# Patient Record
Sex: Male | Born: 1975 | Race: Black or African American | Hispanic: No | Marital: Married | Smoking: Never smoker
Health system: Southern US, Community
[De-identification: ages and names within clinical notes are randomized; demographics above are authoritative.]

## PROBLEM LIST (undated history)

## (undated) DIAGNOSIS — E785 Hyperlipidemia, unspecified: Secondary | ICD-10-CM

## (undated) DIAGNOSIS — I10 Essential (primary) hypertension: Secondary | ICD-10-CM

## (undated) DIAGNOSIS — E119 Type 2 diabetes mellitus without complications: Secondary | ICD-10-CM

## (undated) HISTORY — DX: Essential (primary) hypertension: I10

## (undated) HISTORY — DX: Hyperlipidemia, unspecified: E78.5

## (undated) HISTORY — DX: Type 2 diabetes mellitus without complications: E11.9

---

## 2005-10-17 ENCOUNTER — Emergency Department (HOSPITAL_COMMUNITY): Admission: EM | Admit: 2005-10-17 | Discharge: 2005-10-17 | Payer: Self-pay | Admitting: Emergency Medicine

## 2006-05-18 ENCOUNTER — Ambulatory Visit: Payer: Self-pay

## 2009-11-13 DIAGNOSIS — E7800 Pure hypercholesterolemia, unspecified: Secondary | ICD-10-CM | POA: Insufficient documentation

## 2014-01-26 LAB — BASIC METABOLIC PANEL: GLUCOSE: 98 mg/dL

## 2014-03-13 ENCOUNTER — Ambulatory Visit: Payer: Self-pay | Admitting: Family Medicine

## 2014-03-22 ENCOUNTER — Ambulatory Visit: Payer: Self-pay | Admitting: Family Medicine

## 2014-05-17 DIAGNOSIS — E119 Type 2 diabetes mellitus without complications: Secondary | ICD-10-CM | POA: Insufficient documentation

## 2014-05-17 DIAGNOSIS — IMO0002 Reserved for concepts with insufficient information to code with codable children: Secondary | ICD-10-CM | POA: Insufficient documentation

## 2014-05-17 DIAGNOSIS — E1129 Type 2 diabetes mellitus with other diabetic kidney complication: Secondary | ICD-10-CM | POA: Insufficient documentation

## 2014-05-17 DIAGNOSIS — M25569 Pain in unspecified knee: Secondary | ICD-10-CM | POA: Insufficient documentation

## 2014-05-17 DIAGNOSIS — E1165 Type 2 diabetes mellitus with hyperglycemia: Secondary | ICD-10-CM | POA: Insufficient documentation

## 2014-07-04 ENCOUNTER — Ambulatory Visit: Payer: Self-pay | Admitting: Family Medicine

## 2014-08-07 ENCOUNTER — Ambulatory Visit: Payer: Self-pay | Admitting: Family Medicine

## 2014-11-06 ENCOUNTER — Ambulatory Visit: Payer: Self-pay | Admitting: Family Medicine

## 2014-11-21 ENCOUNTER — Ambulatory Visit: Payer: Self-pay | Admitting: Family Medicine

## 2014-12-19 ENCOUNTER — Ambulatory Visit: Payer: BC Managed Care – PPO | Admitting: Family Medicine

## 2014-12-25 ENCOUNTER — Encounter: Payer: Self-pay | Admitting: Family Medicine

## 2014-12-25 ENCOUNTER — Ambulatory Visit (INDEPENDENT_AMBULATORY_CARE_PROVIDER_SITE_OTHER): Payer: BC Managed Care – PPO | Admitting: Family Medicine

## 2014-12-25 VITALS — BP 118/78 | HR 78 | Temp 98.4°F | Resp 18 | Ht 74.0 in | Wt 222.6 lb

## 2014-12-25 DIAGNOSIS — E1165 Type 2 diabetes mellitus with hyperglycemia: Secondary | ICD-10-CM

## 2014-12-25 DIAGNOSIS — E1121 Type 2 diabetes mellitus with diabetic nephropathy: Secondary | ICD-10-CM

## 2014-12-25 DIAGNOSIS — IMO0002 Reserved for concepts with insufficient information to code with codable children: Secondary | ICD-10-CM

## 2014-12-25 DIAGNOSIS — E1169 Type 2 diabetes mellitus with other specified complication: Secondary | ICD-10-CM

## 2014-12-25 DIAGNOSIS — Z9119 Patient's noncompliance with other medical treatment and regimen: Secondary | ICD-10-CM | POA: Diagnosis not present

## 2014-12-25 DIAGNOSIS — Z23 Encounter for immunization: Secondary | ICD-10-CM | POA: Diagnosis not present

## 2014-12-25 DIAGNOSIS — I10 Essential (primary) hypertension: Secondary | ICD-10-CM

## 2014-12-25 DIAGNOSIS — Z91199 Patient's noncompliance with other medical treatment and regimen due to unspecified reason: Secondary | ICD-10-CM

## 2014-12-25 DIAGNOSIS — E7800 Pure hypercholesterolemia, unspecified: Secondary | ICD-10-CM | POA: Diagnosis not present

## 2014-12-25 LAB — POCT UA - MICROALBUMIN: MICROALBUMIN (UR) POC: 0 mg/L

## 2014-12-25 LAB — POCT GLYCOSYLATED HEMOGLOBIN (HGB A1C): HEMOGLOBIN A1C: 13

## 2014-12-25 MED ORDER — INSULIN ASPART 100 UNIT/ML ~~LOC~~ SOLN: 15.0000 [IU] | Freq: Three times a day (TID) | SUBCUTANEOUS | Status: AC

## 2014-12-25 MED ORDER — INSULIN DETEMIR 100 UNIT/ML ~~LOC~~ SOLN: 40.0000 [IU] | Freq: Two times a day (BID) | SUBCUTANEOUS | Status: AC

## 2014-12-25 NOTE — Patient Instructions (Signed)
Diabetes Mellitus and Food It is important for you to manage your blood sugar (glucose) level. Your blood glucose level can be greatly affected by what you eat. Eating healthier foods in the appropriate amounts throughout the day at about the same time each day will help you control your blood glucose level. It can also help slow or prevent worsening of your diabetes mellitus. Healthy eating may even help you improve the level of your blood pressure and reach or maintain a healthy weight.  General recommendations for healthful eating and cooking habits include:  Eating meals and snacks regularly. Avoid going long periods of time without eating to lose weight.  Eating a diet that consists mainly of plant-based foods, such as fruits, vegetables, nuts, legumes, and whole grains.  Using low-heat cooking methods, such as baking, instead of high-heat cooking methods, such as deep frying. Work with your dietitian to make sure you understand how to use the Nutrition Facts information on food labels. HOW CAN FOOD AFFECT ME? Carbohydrates Carbohydrates affect your blood glucose level more than any other type of food. Your dietitian will help you determine how many carbohydrates to eat at each meal and teach you how to count carbohydrates. Counting carbohydrates is important to keep your blood glucose at a healthy level, especially if you are using insulin or taking certain medicines for diabetes mellitus. Alcohol Alcohol can cause sudden decreases in blood glucose (hypoglycemia), especially if you use insulin or take certain medicines for diabetes mellitus. Hypoglycemia can be a life-threatening condition. Symptoms of hypoglycemia (sleepiness, dizziness, and disorientation) are similar to symptoms of having too much alcohol.  If your health care provider has given you approval to drink alcohol, do so in moderation and use the following guidelines:  Women should not have more than one drink per day, and men  should not have more than two drinks per day. One drink is equal to:  12 oz of beer.  5 oz of wine.  1 oz of hard liquor.  Do not drink on an empty stomach.  Keep yourself hydrated. Have water, diet soda, or unsweetened iced tea.  Regular soda, juice, and other mixers might contain a lot of carbohydrates and should be counted. WHAT FOODS ARE NOT RECOMMENDED? As you make food choices, it is important to remember that all foods are not the same. Some foods have fewer nutrients per serving than other foods, even though they might have the same number of calories or carbohydrates. It is difficult to get your body what it needs when you eat foods with fewer nutrients. Examples of foods that you should avoid that are high in calories and carbohydrates but low in nutrients include:  Trans fats (most processed foods list trans fats on the Nutrition Facts label).  Regular soda.  Juice.  Candy.  Sweets, such as cake, pie, doughnuts, and cookies.  Fried foods. WHAT FOODS CAN I EAT? Eat nutrient-rich foods, which will nourish your body and keep you healthy. The food you should eat also will depend on several factors, including:  The calories you need.  The medicines you take.  Your weight.  Your blood glucose level.  Your blood pressure level.  Your cholesterol level. You should eat a variety of foods, including:  Protein.  Lean cuts of meat.  Proteins low in saturated fats, such as fish, egg whites, and beans. Avoid processed meats.  Fruits and vegetables.  Fruits and vegetables that may help control blood glucose levels, such as apples, mangoes, and   yams.  Dairy products.  Choose fat-free or low-fat dairy products, such as milk, yogurt, and cheese.  Grains, bread, pasta, and rice.  Choose whole grain products, such as multigrain bread, whole oats, and brown rice. These foods may help control blood pressure.  Fats.  Foods containing healthful fats, such as nuts,  avocado, olive oil, canola oil, and fish. DOES EVERYONE WITH DIABETES MELLITUS HAVE THE SAME MEAL PLAN? Because every person with diabetes mellitus is different, there is not one meal plan that works for everyone. It is very important that you meet with a dietitian who will help you create a meal plan that is just right for you.   This information is not intended to replace advice given to you by your health care provider. Make sure you discuss any questions you have with your health care provider.   Document Released: 10/10/2004 Document Revised: 02/03/2014 Document Reviewed: 12/10/2012 Elsevier Interactive Patient Education 2016 [employer]  

## 2014-12-25 NOTE — Progress Notes (Signed)
Name: Darren Singleton   MRN: [id number]    DOB: December 08, 1975   Date:12/25/2014       Progress Note  Subjective  Chief Complaint  Chief Complaint  Patient presents with  . Diabetes  . Hypertension  . Hyperlipidemia    Diabetes Pertinent negatives for hypoglycemia include no dizziness, headaches, nervousness/anxiousness, seizures or tremors. Associated symptoms include polydipsia. Pertinent negatives for diabetes include no blurred vision, no chest pain, no weakness and no weight loss.  Hypertension Pertinent negatives include no blurred vision, chest pain, headaches, neck pain, orthopnea, palpitations or shortness of breath.  Hyperlipidemia Pertinent negatives include no chest pain, focal weakness, myalgias or shortness of breath.  Diabetes   Patient presents for follow-up of diabetes which is present for his age over 5 years years. Is currently on a regimen o .Levemir 40 international units twice a day and NovoLog 100 units 3 times a day with very questionable compliance Patient stateoccasionallywith their diet and exercise. There's been no hypoglycemic episodes and thereispolyuria polydipsia polyphagia. His average fasting glucoses been in the low aroununknown as he is not checking his sugarswith a high aroun-. There is no end organ disease.  Last diabetic eye exam was  more in a year ago   Last visit with dietitian was  was 1 year ago Last microalbumin was obtaitoday and is 0. Patient is chronically noncompliant and has not gotten blood drawn despite being giving the requisition of numerous occasions.   Hypertension   Patient presents for follow-up of hypertension. It has been present for over over 5 years.  Patient states that there is compliance with medical regimen which consists of lisinopril 5 mg once daily when he takes it . There is no end organ disease. Cardiac risk factors include hypertension hyperlipidemia and diabetes and hypertension.  Exercise regimen consist of minimal .   Diet consist of some salt restriction  Hyperlipidemia  Patient has a history of hyperlipidemia for over 5 atorvastatin 40 mg daily at bedtime when he is taking it years.  Current medical regimen consist of atorvastatin 40 mg by mouth daily at bedtime when he is taking it .  Compliance is poor .  Diet and exercise are currently followed rarely .  Risk factors for cardiovascular disease include hyperlipidemia hypertension medical noncompliance sedentary lifestyle noncompliance with meds .   There have been no side effects from the medication.   .    Past Medical History  Diagnosis Date  . Hypertension   . Hyperlipidemia   . Diabetes mellitus without complication [facility])     Social History  Substance Use Topics  . Smoking status: Never Smoker   . Smokeless tobacco: Not on file  . Alcohol Use: No     Current outpatient prescriptions:  .  aspirin 81 MG tablet, Take 81 mg by mouth daily., Disp: , Rfl:  .  insulin aspart (NOVOLOG) 100 UNIT/ML injection, Inject 15 Units into the skin 3 (three) times daily before meals., Disp: , Rfl:  .  insulin detemir (LEVEMIR) 100 UNIT/ML injection, Inject 40 Units into the skin 2 (two) times daily., Disp: , Rfl:   No Known Allergies  Review of Systems  Constitutional: Negative for fever, chills and weight loss.  HENT: Negative for congestion, hearing loss, sore throat and tinnitus.   Eyes: Negative for blurred vision, double vision and redness.  Respiratory: Negative for cough, hemoptysis and shortness of breath.   Cardiovascular: Negative for chest pain, palpitations, orthopnea, claudication and leg swelling.  Gastrointestinal: Negative  for heartburn, nausea, vomiting, diarrhea, constipation and blood in stool.  Genitourinary: Positive for frequency. Negative for dysuria, urgency and hematuria.  Musculoskeletal: Negative for myalgias, back pain, joint pain, falls and neck pain.  Skin: Negative for itching.  Neurological: Negative for dizziness,  tingling, tremors, focal weakness, seizures, loss of consciousness, weakness and headaches.  Endo/Heme/Allergies: Positive for polydipsia. Does not bruise/bleed easily.  Psychiatric/Behavioral: Negative for depression and substance abuse. The patient is not nervous/anxious and does not have insomnia.      Objective  Filed Vitals:   12/25/14 0849  BP: 118/78  Pulse: 78  Temp: 98.4 F (36.9 C)  Resp: 18  Height: 6\' 2"  (1.88 m)  Weight: 222 lb 9 oz (100.954 kg)  SpO2: 96%     Physical Exam  Constitutional: He is oriented to person, place, and time and well-developed, well-nourished, and in no distress.  HENT:  Head: Normocephalic.  Eyes: EOM are normal. Pupils are equal, round, and reactive to light.  Neck: Normal range of motion. Neck supple. No thyromegaly present.  Cardiovascular: Normal rate, regular rhythm and normal heart sounds.   No murmur heard. Pulmonary/Chest: Effort normal and breath sounds normal. No respiratory distress. He has no wheezes.  Abdominal: Soft. Bowel sounds are normal.  Musculoskeletal: Normal range of motion. He exhibits no edema.  Lymphadenopathy:    He has no cervical adenopathy.  Neurological: He is alert and oriented to person, place, and time. No cranial nerve deficit. Gait normal. Coordination normal.  Skin: Skin is warm and dry. No rash noted.  Psychiatric: Affect and judgment normal.      Assessment & Plan  1. Type 2 diabetes mellitus with other specified complication (HCC) Chronically noncompliant - POCT HgB A1C - POCT UA - Microalbumin - C peptide antibody, IgG - Ambulatory referral to Endocrinology  2. Benign essential HTN Well-controlled - Comprehensive Metabolic Panel (CMET)  3. Hypercholesterolemia without hypertriglyceridemia Again record given requisition for lab draw - Lipid Profile - TSH  4. Need for influenza vaccination Given - Flu Vaccine QUAD 36+ mos PF IM (Fluarix & Fluzone Quad PF)  5. Uncontrolled type 2  diabetes mellitus with diabetic nephropathy, without long-term current use of insulin (HCC) Referral to nephrologist  6. H/O noncompliance with medical treatment, presenting hazards to health Again admonished diabetes untreated will lead to vascular disease possible renal failure and dialysis blindness amputations cardiac disease and death

## 2014-12-29 DIAGNOSIS — Z91199 Patient's noncompliance with other medical treatment and regimen due to unspecified reason: Secondary | ICD-10-CM | POA: Insufficient documentation

## 2014-12-29 DIAGNOSIS — Z9119 Patient's noncompliance with other medical treatment and regimen: Secondary | ICD-10-CM | POA: Insufficient documentation

## 2015-04-24 ENCOUNTER — Ambulatory Visit: Payer: BC Managed Care – PPO | Admitting: Family Medicine

## 2015-05-14 ENCOUNTER — Telehealth: Payer: Self-pay | Admitting: Family Medicine

## 2015-05-14 NOTE — Telephone Encounter (Signed)
Patient requesting samples of novolog and levimer

## 2015-06-08 IMAGING — CR DG KNEE COMPLETE 4+V*R*
1 series · 4 of 4 positions shown · non-contrast
Comparison: None.

CLINICAL DATA: RIGHT knee pain since fell 4 days ago landing on
lateral aspect of RIGHT knee

EXAM:
None

[Series 1: kdxr knee rt comp with obliques · 0.14mm/px · 4 of 4 slices shown]
[im 1/4]
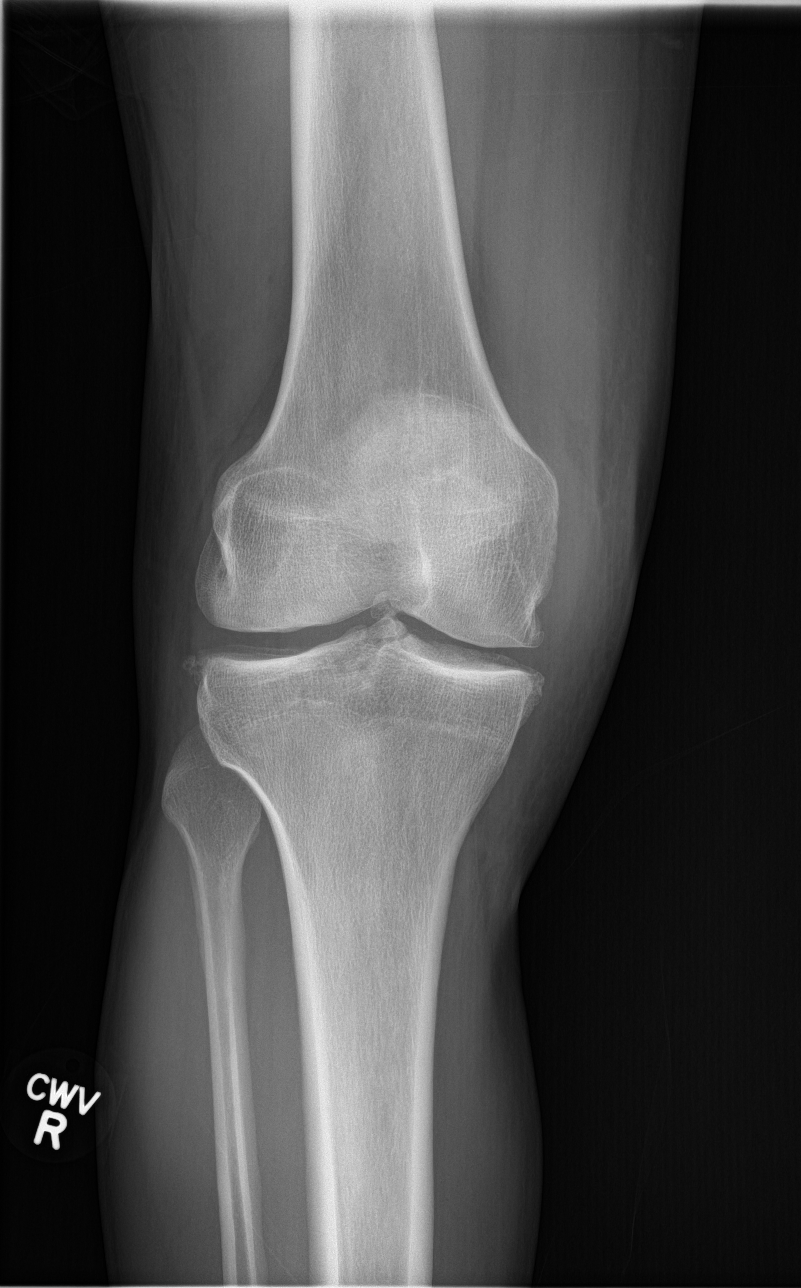
[im 2/4]
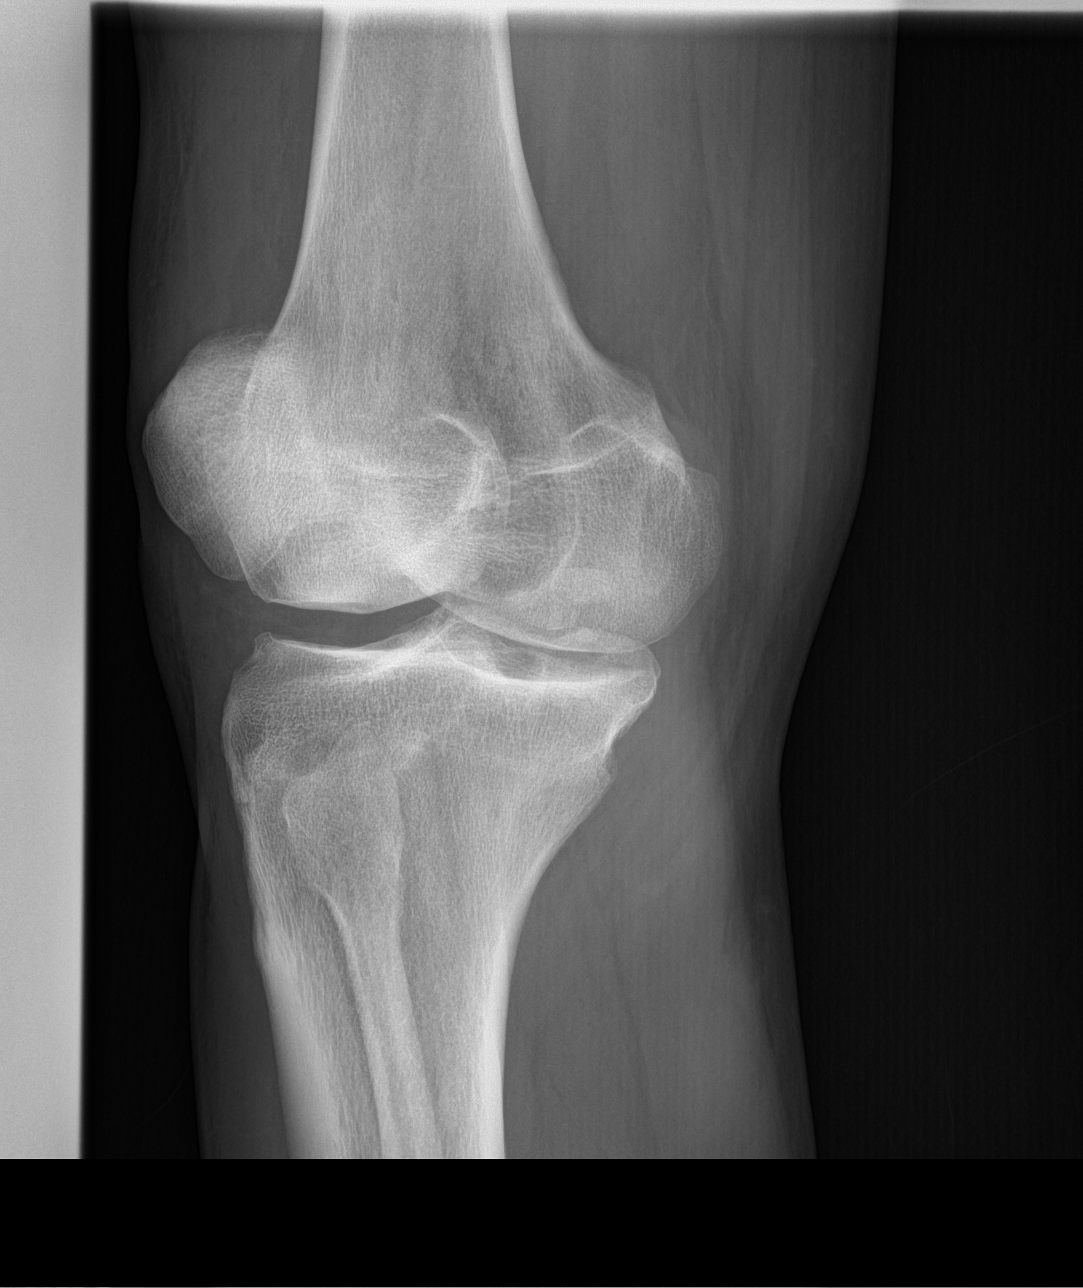
[im 3/4]
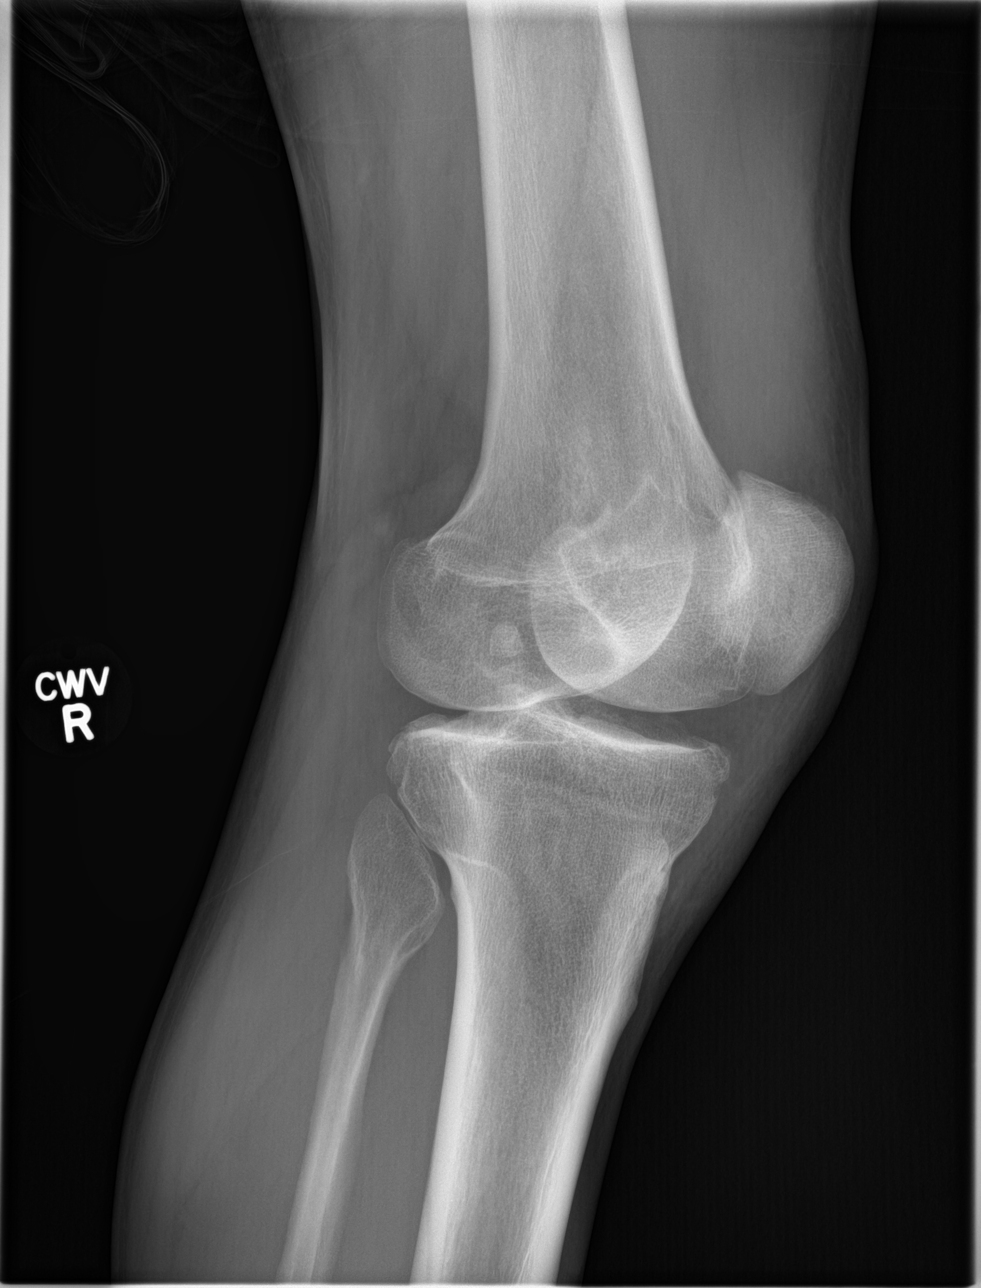
[im 4/4]
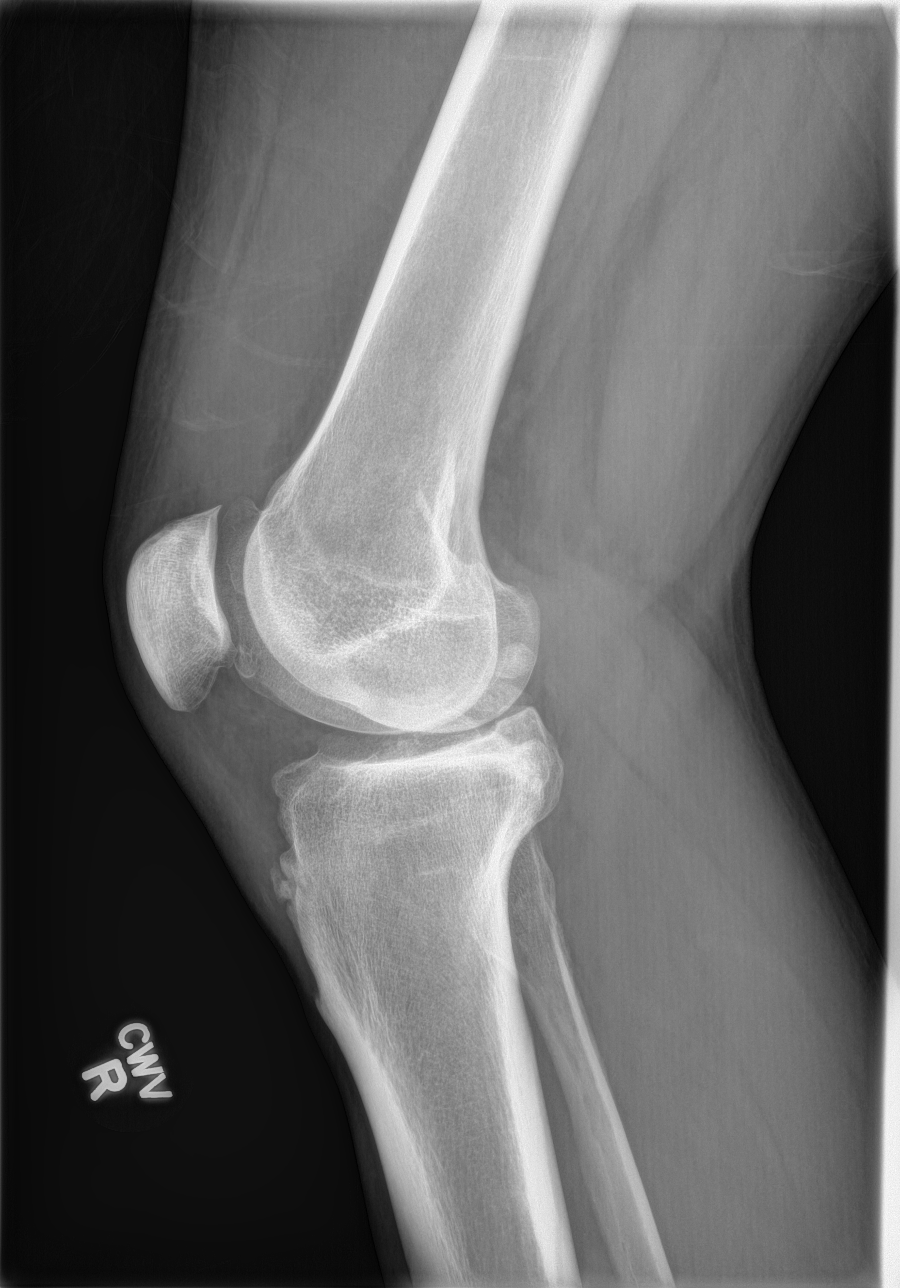

[4 of 4 positions shown; findings below may reference images not displayed]

FINDINGS: Osseous mineralization normal for technique.

Medial compartment joint space narrowing.

Scattered spurs.

Non fused ossicle at tibial spine.

No acute fracture, dislocation or bone destruction.

Knee joint effusion present.
IMPRESSION: Tricompartmental degenerative changes of the RIGHT knee with knee
joint effusion.

No definite acute bony abnormalities.

## 2022-12-29 ENCOUNTER — Encounter: Payer: Self-pay | Admitting: Emergency Medicine

## 2022-12-29 ENCOUNTER — Ambulatory Visit: Payer: 59

## 2022-12-29 ENCOUNTER — Ambulatory Visit
Admission: EM | Admit: 2022-12-29 | Discharge: 2022-12-29 | Disposition: A | Discharge status: Home / Self Care | Payer: 59 | Attending: Internal Medicine | Admitting: Internal Medicine

## 2022-12-29 DIAGNOSIS — J40 Bronchitis, not specified as acute or chronic: Secondary | ICD-10-CM

## 2022-12-29 DIAGNOSIS — R051 Acute cough: Secondary | ICD-10-CM | POA: Diagnosis not present

## 2022-12-29 MED ORDER — AZITHROMYCIN 250 MG PO TABS: 250.0000 mg | ORAL_TABLET | Freq: Every day | ORAL | 0 refills | Status: AC

## 2022-12-29 MED ORDER — BENZONATATE 200 MG PO CAPS: 200.0000 mg | ORAL_CAPSULE | Freq: Three times a day (TID) | ORAL | 0 refills | Status: AC | PRN

## 2022-12-29 NOTE — ED Triage Notes (Signed)
Pt c/o cough, headache, congestion and body aches x 5 days.

## 2022-12-29 NOTE — ED Provider Notes (Signed)
[facility]    CSN: [id number] Arrival date & time: 12/29/22  1615      History   Chief Complaint Chief Complaint  Patient presents with   Cough   Headache   Generalized Body Aches    HPI Darren Singleton is a 47 y.o. male with a past medical history of diabetes presents for evaluation of URI symptoms for 5 days. Patient reports associated symptoms of cough, headache, congestion, body aches, chills. Denies N/V/D, sore throat, documented fevers, ear pain, shortness of breath. Patient does not have a hx of asthma. Patient does not have a history of smoking.  Reports no sick contacts.  Pt has taken cough medicine OTC for symptoms. Pt has no other concerns at this time.    Cough Associated symptoms: chills, headaches and myalgias   Headache Associated symptoms: congestion, cough and myalgias     Past Medical History:  Diagnosis Date   Diabetes mellitus without complication (HCC)    Hyperlipidemia    Hypertension     Patient Active Problem List   Diagnosis Date Noted   H/O noncompliance with medical treatment, presenting hazards to health 12/29/2014   Need for prophylactic vaccination and inoculation against influenza 12/25/2014   Pneumococcal vaccination given 12/25/2014   Gonalgia 05/17/2014   Type II diabetes mellitus, uncontrolled 05/17/2014   Diabetes (HCC) 05/17/2014   Type II diabetes mellitus with renal manifestations, uncontrolled 05/17/2014   Hypercholesterolemia without hypertriglyceridemia 11/13/2009   Benign essential HTN 11/13/2009    History reviewed. No pertinent surgical history.     Home Medications    Prior to Admission medications   Medication Sig Start Date End Date Taking? Authorizing Provider  azithromycin (ZITHROMAX) 250 MG tablet Take 1 tablet (250 mg total) by mouth daily. Take first 2 tablets together, then 1 every day until finished. 12/29/22  Yes Radford Pax, NP  benzonatate (TESSALON) 200 MG capsule Take 1 capsule (200  mg total) by mouth 3 (three) times daily as needed. 12/29/22  Yes Radford Pax, NP  aspirin 81 MG tablet Take 81 mg by mouth daily.    [provider]  insulin aspart (NOVOLOG) 100 UNIT/ML injection Inject 15 Units into the skin 3 (three) times daily before meals. 12/25/14   Dennison Mascot, MD  insulin detemir (LEVEMIR) 100 UNIT/ML injection Inject 0.4 mLs (40 Units total) into the skin 2 (two) times daily. 12/25/14   Dennison Mascot, MD    Family History Family History  Problem Relation Age of Onset   Coronary artery disease Father    Diabetes Father     Social History Social History   Tobacco Use   Smoking status: Never   Smokeless tobacco: Never  Vaping Use   Vaping status: Never Used  Substance Use Topics   Alcohol use: Yes   Drug use: No     Allergies   Patient has no known allergies.   Review of Systems Review of Systems  Constitutional:  Positive for chills.  HENT:  Positive for congestion.   Respiratory:  Positive for cough.   Musculoskeletal:  Positive for myalgias.  Neurological:  Positive for headaches.     Physical Exam Triage Vital Signs ED Triage Vitals  Encounter Vitals Group     BP 12/29/22 1641 (!) 169/96     Systolic BP Percentile --      Diastolic BP Percentile --      Pulse Rate 12/29/22 1641 95     Resp 12/29/22 1641 18  Temp 12/29/22 1641 99.2 F (37.3 C)     Temp Source 12/29/22 1641 Oral     SpO2 12/29/22 1641 92 %     Weight --      Height --      Head Circumference --      Peak Flow --      Pain Score 12/29/22 1640 0     Pain Loc --      Pain Education --      Exclude from Growth Chart --    No data found.  Updated Vital Signs BP (!) 169/96 (BP Location: Left Arm)   Pulse 95   Temp 99.2 F (37.3 C) (Oral)   Resp 18   SpO2 92%   Visual Acuity Right Eye Distance:   Left Eye Distance:   Bilateral Distance:    Right Eye Near:   Left Eye Near:    Bilateral Near:     Physical Exam Vitals and nursing  note reviewed.  Constitutional:      General: He is not in acute distress.    Appearance: Normal appearance. He is not ill-appearing or toxic-appearing.  HENT:     Head: Normocephalic and atraumatic.     Right Ear: Tympanic membrane and ear canal normal.     Left Ear: Tympanic membrane and ear canal normal.     Nose: Congestion present.     Mouth/Throat:     Mouth: Mucous membranes are moist.     Pharynx: No oropharyngeal exudate or posterior oropharyngeal erythema.  Eyes:     Pupils: Pupils are equal, round, and reactive to light.  Cardiovascular:     Rate and Rhythm: Normal rate and regular rhythm.     Heart sounds: Normal heart sounds.  Pulmonary:     Effort: Pulmonary effort is normal.     Breath sounds: Normal breath sounds.  Musculoskeletal:     Cervical back: Normal range of motion and neck supple.  Lymphadenopathy:     Cervical: No cervical adenopathy.  Skin:    General: Skin is warm and dry.  Neurological:     General: No focal deficit present.     Mental Status: He is alert and oriented to person, place, and time.  Psychiatric:        Mood and Affect: Mood normal.        Behavior: Behavior normal.      UC Treatments / Results  Labs (all labs ordered are listed, but only abnormal results are displayed) Labs Reviewed - No data to display  EKG   Radiology No results found.  Procedures Procedures (including critical care time)  Medications Ordered in UC Medications - No data to display  Initial Impression / Assessment and Plan / UC Course  I have reviewed the triage vital signs and the nursing notes.  Pertinent labs & imaging results that were available during my care of the patient were reviewed by me and considered in my medical decision making (see chart for details).     Reviewed exam and symptoms with patient.  No red flags.  Wet read of x-ray without obvious consolidation.  Given presentation will start Zithromax and Tessalon.  PCP follow-up 2  to 3 days for recheck.  ER precautions reviewed Final Clinical Impressions(s) / UC Diagnoses   Final diagnoses:  Acute cough  Bronchitis     Discharge Instructions      Start Zithromax as prescribed.  Tessalon as needed for cough.  Lots of rest and  fluids.  Please follow-up with your PCP if your symptoms does not improve.  Please go to the ER for any worsening symptoms.  Hope you feel better soon!     ED Prescriptions     Medication Sig Dispense Auth. Provider   azithromycin (ZITHROMAX) 250 MG tablet Take 1 tablet (250 mg total) by mouth daily. Take first 2 tablets together, then 1 every day until finished. 6 tablet Radford Pax, NP   benzonatate (TESSALON) 200 MG capsule Take 1 capsule (200 mg total) by mouth 3 (three) times daily as needed. 20 capsule Radford Pax, NP      PDMP not reviewed this encounter.   Radford Pax, NP 12/29/22 [phone]

## 2022-12-29 NOTE — Discharge Instructions (Addendum)
Start Zithromax as prescribed.  Tessalon as needed for cough.  Lots of rest and fluids.  Please follow-up with your PCP if your symptoms does not improve.  Please go to the ER for any worsening symptoms.  Hope you feel better soon!

## 2022-12-30 ENCOUNTER — Telehealth: Payer: Self-pay | Admitting: Physician Assistant

## 2022-12-30 NOTE — Telephone Encounter (Signed)
Contacted patient to review results of a chest x-ray which was negative.  Patient was given azithromycin.  I explained to him that he may take this medication if he is feeling very poorly or hold it in case he needs it for any worsening symptoms but his x-ray did not show any definite pneumonia.
